# Patient Record
Sex: Female | Born: 1990 | Race: Black or African American | Hispanic: No | Marital: Single | Smoking: Never smoker
Health system: Southern US, Community
[De-identification: ages and names within clinical notes are randomized; demographics above are authoritative.]

---

## 2020-08-12 ENCOUNTER — Ambulatory Visit (INDEPENDENT_AMBULATORY_CARE_PROVIDER_SITE_OTHER): Payer: Self-pay

## 2020-08-12 ENCOUNTER — Encounter (HOSPITAL_COMMUNITY): Payer: Self-pay | Admitting: *Deleted

## 2020-08-12 ENCOUNTER — Other Ambulatory Visit: Payer: Self-pay

## 2020-08-12 ENCOUNTER — Ambulatory Visit (HOSPITAL_COMMUNITY)
Admission: EM | Admit: 2020-08-12 | Discharge: 2020-08-12 | Disposition: A | Discharge status: Home / Self Care | Payer: Self-pay | Attending: Student | Admitting: Student

## 2020-08-12 DIAGNOSIS — S161XXA Strain of muscle, fascia and tendon at neck level, initial encounter: Secondary | ICD-10-CM

## 2020-08-12 DIAGNOSIS — M542 Cervicalgia: Secondary | ICD-10-CM

## 2020-08-12 DIAGNOSIS — R58 Hemorrhage, not elsewhere classified: Secondary | ICD-10-CM

## 2020-08-12 DIAGNOSIS — R1032 Left lower quadrant pain: Secondary | ICD-10-CM

## 2020-08-12 DIAGNOSIS — M25551 Pain in right hip: Secondary | ICD-10-CM

## 2020-08-12 MED ORDER — TIZANIDINE HCL 4 MG PO CAPS: 4.0000 mg | ORAL_CAPSULE | Freq: Three times a day (TID) | ORAL | 0 refills | Status: AC
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
## 2020-08-12 NOTE — Discharge Instructions (Signed)
-  Your xrays today showed no acute fracture.  -For further evaluation of abdominal pain, please follow-up with [facility] (or other) ED.
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
